# Patient Record
Sex: Male | Born: 1990 | Race: White | Hispanic: No | Marital: Married | Smoking: Never smoker
Health system: Southern US, Community
[De-identification: ages and names within clinical notes are randomized; demographics above are authoritative.]

## PROBLEM LIST (undated history)

## (undated) HISTORY — PX: ELBOW SURGERY: SHX618

## (undated) HISTORY — PX: KNEE SURGERY: SHX244

---
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
## 2011-04-07 ENCOUNTER — Ambulatory Visit: Payer: BC Managed Care – PPO | Attending: Orthopedic Surgery | Admitting: Physical Therapy

## 2011-04-07 DIAGNOSIS — R5381 Other malaise: Secondary | ICD-10-CM | POA: Insufficient documentation

## 2011-04-07 DIAGNOSIS — M25669 Stiffness of unspecified knee, not elsewhere classified: Secondary | ICD-10-CM | POA: Insufficient documentation

## 2011-04-07 DIAGNOSIS — R262 Difficulty in walking, not elsewhere classified: Secondary | ICD-10-CM | POA: Insufficient documentation

## 2011-04-07 DIAGNOSIS — IMO0001 Reserved for inherently not codable concepts without codable children: Secondary | ICD-10-CM | POA: Insufficient documentation

## 2011-04-07 DIAGNOSIS — M25569 Pain in unspecified knee: Secondary | ICD-10-CM | POA: Insufficient documentation

## 2011-04-09 ENCOUNTER — Ambulatory Visit: Payer: BC Managed Care – PPO | Admitting: Physical Therapy

## 2011-04-10 ENCOUNTER — Ambulatory Visit: Payer: BC Managed Care – PPO | Admitting: Physical Therapy

## 2011-04-14 ENCOUNTER — Ambulatory Visit: Payer: BC Managed Care – PPO | Attending: Orthopedic Surgery | Admitting: Physical Therapy

## 2011-04-14 DIAGNOSIS — R262 Difficulty in walking, not elsewhere classified: Secondary | ICD-10-CM | POA: Insufficient documentation

## 2011-04-14 DIAGNOSIS — R5381 Other malaise: Secondary | ICD-10-CM | POA: Insufficient documentation

## 2011-04-14 DIAGNOSIS — M25569 Pain in unspecified knee: Secondary | ICD-10-CM | POA: Insufficient documentation

## 2011-04-14 DIAGNOSIS — IMO0001 Reserved for inherently not codable concepts without codable children: Secondary | ICD-10-CM | POA: Insufficient documentation

## 2011-04-14 DIAGNOSIS — M25669 Stiffness of unspecified knee, not elsewhere classified: Secondary | ICD-10-CM | POA: Insufficient documentation

## 2011-04-15 ENCOUNTER — Ambulatory Visit: Payer: BC Managed Care – PPO | Admitting: *Deleted

## 2011-04-17 ENCOUNTER — Ambulatory Visit: Payer: BC Managed Care – PPO | Admitting: *Deleted

## 2011-04-21 ENCOUNTER — Ambulatory Visit: Payer: BC Managed Care – PPO | Admitting: Physical Therapy

## 2011-04-22 ENCOUNTER — Encounter: Payer: BC Managed Care – PPO | Admitting: Physical Therapy

## 2011-04-23 ENCOUNTER — Ambulatory Visit: Payer: BC Managed Care – PPO | Admitting: Physical Therapy

## 2011-04-28 ENCOUNTER — Ambulatory Visit: Payer: BC Managed Care – PPO | Admitting: Physical Therapy

## 2011-04-29 ENCOUNTER — Ambulatory Visit: Payer: BC Managed Care – PPO | Admitting: *Deleted

## 2011-05-01 ENCOUNTER — Ambulatory Visit: Payer: BC Managed Care – PPO | Admitting: *Deleted

## 2011-05-07 ENCOUNTER — Ambulatory Visit: Payer: BC Managed Care – PPO | Admitting: Physical Therapy

## 2011-05-09 ENCOUNTER — Ambulatory Visit: Payer: BC Managed Care – PPO | Admitting: Physical Therapy

## 2011-05-12 ENCOUNTER — Ambulatory Visit: Payer: BC Managed Care – PPO | Admitting: Physical Therapy

## 2011-05-14 ENCOUNTER — Encounter: Payer: BC Managed Care – PPO | Admitting: Physical Therapy

## 2011-05-16 ENCOUNTER — Ambulatory Visit: Payer: BC Managed Care – PPO | Attending: Orthopedic Surgery | Admitting: Physical Therapy

## 2011-05-16 DIAGNOSIS — R5381 Other malaise: Secondary | ICD-10-CM | POA: Insufficient documentation

## 2011-05-16 DIAGNOSIS — M25669 Stiffness of unspecified knee, not elsewhere classified: Secondary | ICD-10-CM | POA: Insufficient documentation

## 2011-05-16 DIAGNOSIS — IMO0001 Reserved for inherently not codable concepts without codable children: Secondary | ICD-10-CM | POA: Insufficient documentation

## 2011-05-16 DIAGNOSIS — M25569 Pain in unspecified knee: Secondary | ICD-10-CM | POA: Insufficient documentation

## 2011-05-16 DIAGNOSIS — R262 Difficulty in walking, not elsewhere classified: Secondary | ICD-10-CM | POA: Insufficient documentation

## 2012-08-16 ENCOUNTER — Telehealth: Payer: Self-pay | Admitting: Nurse Practitioner

## 2012-08-16 NOTE — Telephone Encounter (Signed)
appt made

## 2012-09-02 ENCOUNTER — Telehealth: Payer: Self-pay | Admitting: Nurse Practitioner

## 2012-09-02 ENCOUNTER — Encounter: Payer: Self-pay | Admitting: Nurse Practitioner

## 2012-09-02 ENCOUNTER — Ambulatory Visit (INDEPENDENT_AMBULATORY_CARE_PROVIDER_SITE_OTHER): Payer: BC Managed Care – PPO | Admitting: Nurse Practitioner

## 2012-09-02 VITALS — BP 123/70 | HR 84 | Temp 97.9°F | Ht 72.0 in | Wt 211.0 lb

## 2012-09-02 DIAGNOSIS — Z139 Encounter for screening, unspecified: Secondary | ICD-10-CM

## 2012-09-02 DIAGNOSIS — Z0000 Encounter for general adult medical examination without abnormal findings: Secondary | ICD-10-CM

## 2012-09-02 DIAGNOSIS — Z111 Encounter for screening for respiratory tuberculosis: Secondary | ICD-10-CM

## 2012-09-02 NOTE — Progress Notes (Signed)
  Subjective:    Patient ID: Albert Gross, male    DOB: [birth year], 22 y.o.   MRN: [id number]  HPI- Patient here today for a CPE. 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 He is doing well without complaints.    Review of Systems  Constitutional: Negative.   HENT: Negative.   Eyes: Negative.   Respiratory: Negative.   Cardiovascular: Negative.   Gastrointestinal: Negative.   Endocrine: Negative.   Genitourinary: Negative.   Musculoskeletal: Negative.   Allergic/Immunologic: Negative.   Neurological: Negative.   Psychiatric/Behavioral: Negative.        Objective:   Physical Exam  Constitutional: He is oriented to person, place, and time. He appears well-developed and well-nourished.  HENT:  Head: Normocephalic.  Right Ear: External ear normal.  Mouth/Throat: Oropharynx is clear and moist.  Eyes: Conjunctivae and EOM are normal. Pupils are equal, round, and reactive to light.  Neck: Normal range of motion. Neck supple.  Cardiovascular: Normal rate, regular rhythm and normal heart sounds.   No murmur heard. Pulmonary/Chest: Effort normal and breath sounds normal.  Abdominal: Soft. Bowel sounds are normal.  Musculoskeletal: Normal range of motion.  Neurological: He is alert and oriented to person, place, and time.  Skin: Skin is warm and dry.  Psychiatric: He has a normal mood and affect. His behavior is normal. Judgment and thought content normal.          Assessment & Plan:

## 2012-09-02 NOTE — Patient Instructions (Signed)
Health Maintenance, 18- to 21-Year-Old SCHOOL PERFORMANCE After high school completion, the young adult may be attending college, technical or vocational school, or entering the military or the work force. SOCIAL AND EMOTIONAL DEVELOPMENT The young adult establishes adult relationships and explores sexual identity. Young adults may be living at home or in a college dorm or apartment. Increasing independence is important with young adults. Throughout adolescence, teens should assume responsibility of their own health care. IMMUNIZATIONS Most young adults should be fully vaccinated. A booster dose of Tdap (tetanus, diphtheria, and pertussis, or "whooping cough"), a dose of meningococcal vaccine to protect against a certain type of bacterial meningitis, hepatitis A, human papillomarvirus (HPV), chickenpox, or measles vaccines may be indicated, if not given at an earlier age. Annual influenza or "flu" vaccination should be considered during flu season.   TESTING Annual screening for vision and hearing problems is recommended. Vision should be screened objectively at least once between 18 and 21 years of age. The young adult may be screened for anemia or tuberculosis. Young adults should have a blood test to check for high cholesterol during this time period. Young adults should be screened for use of alcohol and drugs. If the young adult is sexually active, screening for sexually transmitted infections, pregnancy, or HIV may be performed. Screening for cervical cancer should be performed within 3 years of beginning sexual activity. NUTRITION AND ORAL HEALTH  Adequate calcium intake is important. Consume 3 servings of low-fat milk and dairy products daily. For those who do not drink milk or consume dairy products, calcium enriched foods, such as juice, bread, or cereal, dark, leafy greens, or canned fish are alternate sources of calcium.   Drink plenty of water. Limit fruit juice to 8 to 12 ounces per day.  Avoid sugary beverages or sodas.   Discourage skipping meals, especially breakfast. Teens should eat a good variety of vegetables and fruits, as well as lean meats.   Avoid high fat, high salt, and high sugar foods, such as candy, chips, and cookies.   Encourage young adults to participate in meal planning and preparation.   Eat meals together as a family whenever possible. Encourage conversation at mealtime.   Limit fast food choices and eating out at restaurants.   Brush teeth twice a day and floss.   Schedule dental exams twice a year.  SLEEP Regular sleep habits are important. PHYSICAL, SOCIAL, AND EMOTIONAL DEVELOPMENT  One hour of regular physical activity daily is recommended. Continue to participate in sports.   Encourage young adults to develop their own interests and consider community service or volunteerism.   Provide guidance to the young adult in making decisions about college and work plans.   Make sure that young adults know that they should never be in a situation that makes them uncomfortable, and they should tell partners if they do not want to engage in sexual activity.   Talk to the young adult about body image. Eating disorders may be noted at this time. Young adults may also be concerned about being overweight. Monitor the young adult for weight gain or loss.   Mood disturbances, depression, anxiety, alcoholism, or attention problems may be noted in young adults. Talk to the caregiver if there are concerns about mental illness.   Negotiate limit setting and independent decision making.   Encourage the young adult to handle conflict without physical violence.   Avoid loud noises which may impair hearing.   Limit television and computer time to 2 hours per   day. Individuals who engage in excessive sedentary activity are more likely to become overweight.  RISK BEHAVIORS  Sexually active young adults need to take precautions against pregnancy and sexually  transmitted infections. Talk to young adults about contraception.   Provide a tobacco-free and drug-free environment for the young adult. Talk to the young adult about drug, tobacco, and alcohol use among friends or at friends' homes. Make sure the young adult knows that smoking tobacco or marijuana and taking drugs have health consequences and may impact brain development.   Teach the young adult about appropriate use of over-the-counter or prescription medicines.   Establish guidelines for driving and for riding with friends.   Talk to young adults about the risks of drinking and driving or boating. Encourage the young adult to call you if he or she or friends have been drinking or using drugs.   Remind young adults to wear seat belts at all times in cars and life vests in boats.   Young adults should always wear a properly fitted helmet when they are riding a bicycle.   Use caution with all-terrain vehicles (ATVs) or other motorized vehicles.   Do not keep handguns in the home. (If you do, the gun and ammunition should be locked separately and out of the young adult's access.)   Equip your home with smoke detectors and change the batteries regularly. Make sure all family members know the fire escape plans for your home.   Teach young adults not to swim alone and not to dive in shallow water.   All individuals should wear sunscreen that protects against UVA and UVB light with at least a sun protection factor (SPF) of 30 when out in the sun. This minimizes sun burning.  WHAT'S NEXT? Young adults should visit their pediatrician or family physician yearly. By young adulthood, health care should be transitioned to a family physician or internal medicine specialist. Sexually active females may want to begin annual physical exams with a gynecologist. Document Released: 07/24/2006 Document Revised: 07/21/2011 Document Reviewed: 08/13/2006 ExitCare Patient Information 2013 [employer].    

## 2012-09-02 NOTE — Addendum Note (Signed)
Addended by: Bennie Pierini on: 09/02/2012 03:24 PM   Modules accepted: Orders

## 2012-09-02 NOTE — Telephone Encounter (Signed)
Patient aware last one was 05/18/07

## 2012-09-02 NOTE — Progress Notes (Signed)
Pt tolerated ppd well

## 2012-09-04 LAB — TB SKIN TEST
Induration: 0 mm
TB Skin Test: NEGATIVE

## 2012-12-08 ENCOUNTER — Ambulatory Visit (INDEPENDENT_AMBULATORY_CARE_PROVIDER_SITE_OTHER): Payer: BC Managed Care – PPO | Admitting: Physician Assistant

## 2012-12-08 ENCOUNTER — Encounter: Payer: Self-pay | Admitting: Physician Assistant

## 2012-12-08 VITALS — BP 111/68 | HR 57 | Temp 99.1°F | Ht 72.0 in | Wt 216.0 lb

## 2012-12-08 DIAGNOSIS — L259 Unspecified contact dermatitis, unspecified cause: Secondary | ICD-10-CM

## 2012-12-08 DIAGNOSIS — L309 Dermatitis, unspecified: Secondary | ICD-10-CM

## 2012-12-08 NOTE — Patient Instructions (Signed)
Insect Bite  Mosquitoes, flies, fleas, bedbugs, and many other insects can bite. Insect bites are different from insect stings. A sting is when venom is injected into the skin. Some insect bites can transmit infectious diseases.  SYMPTOMS   Insect bites usually turn red, swell, and itch for 2 to 4 days. They often go away on their own.  TREATMENT   Your caregiver may prescribe antibiotic medicines if a bacterial infection develops in the bite.  HOME CARE INSTRUCTIONS   Do not scratch the bite area.   Keep the bite area clean and dry. Wash the bite area thoroughly with soap and water.   Put ice or cool compresses on the bite area.   Put ice in a plastic bag.   Place a towel between your skin and the bag.   Leave the ice on for 20 minutes, 4 times a day for the first 2 to 3 days, or as directed.   You may apply a baking soda paste, cortisone cream, or calamine lotion to the bite area as directed by your caregiver. This can help reduce itching and swelling.   Only take over-the-counter or prescription medicines as directed by your caregiver.   If you are given antibiotics, take them as directed. Finish them even if you start to feel better.  You may need a tetanus shot if:   You cannot remember when you had your last tetanus shot.   You have never had a tetanus shot.   The injury broke your skin.  If you get a tetanus shot, your arm may swell, get red, and feel warm to the touch. This is common and not a problem. If you need a tetanus shot and you choose not to have one, there is a rare chance of getting tetanus. Sickness from tetanus can be serious.  SEEK IMMEDIATE MEDICAL CARE IF:    You have increased pain, redness, or swelling in the bite area.   You see a red line on the skin coming from the bite.   You have a fever.   You have joint pain.   You have a headache or neck pain.   You have unusual weakness.   You have a rash.   You have chest pain or shortness of breath.    You have abdominal pain, nausea, or vomiting.   You feel unusually tired or sleepy.  MAKE SURE YOU:    Understand these instructions.   Will watch your condition.   Will get help right away if you are not doing well or get worse.  Document Released: 06/05/2004 Document Revised: 07/21/2011 Document Reviewed: 11/27/2010  ExitCare Patient Information 2014 [employer].

## 2012-12-08 NOTE — Progress Notes (Signed)
Subjective:     Patient ID: Albert Gross, male   DOB: [birth year], 22 y.o.   MRN: [id number]  HPI Pt with bug bites or rash to the L lateral mid back No pain to the sites Area there for 2 -3 days He denies any pain  He just started having pruritus  Review of Systems  All other systems reviewed and are negative.       Objective:   Physical Exam 3 discrete areas to the L lateral back No surrounding edema or induration No drainage from the sites No other lesions seen    Assessment:     Derm    Plan:     OTC antihistamines Cool compresses F/u prn

## 2012-12-17 ENCOUNTER — Encounter: Payer: Self-pay | Admitting: Nurse Practitioner

## 2012-12-17 ENCOUNTER — Ambulatory Visit (INDEPENDENT_AMBULATORY_CARE_PROVIDER_SITE_OTHER): Payer: BC Managed Care – PPO | Admitting: Nurse Practitioner

## 2012-12-17 VITALS — BP 126/74 | HR 83 | Temp 98.9°F | Ht 70.0 in | Wt 217.0 lb

## 2012-12-17 DIAGNOSIS — R1032 Left lower quadrant pain: Secondary | ICD-10-CM

## 2012-12-17 DIAGNOSIS — K5900 Constipation, unspecified: Secondary | ICD-10-CM

## 2012-12-17 DIAGNOSIS — L03319 Cellulitis of trunk, unspecified: Secondary | ICD-10-CM

## 2012-12-17 DIAGNOSIS — L03312 Cellulitis of back [any part except buttock]: Secondary | ICD-10-CM

## 2012-12-17 MED ORDER — AMOXICILLIN 875 MG PO TABS: 875.0000 mg | ORAL_TABLET | Freq: Two times a day (BID) | ORAL | Status: DC

## 2012-12-17 NOTE — Progress Notes (Signed)
  Subjective:    Patient ID: Albert Gross, male    DOB: September 06, 1990, 22 y.o.   MRN: [id number]  HPI  Patient here today c/o left sided pain and swelling. Voiding this morning and was a little difficult. Had a bowel movement today.    Review of Systems  All other systems reviewed and are negative.       Objective:   Physical Exam  Constitutional: He appears well-developed and well-nourished.  Cardiovascular: Normal rate and normal heart sounds.   Pulmonary/Chest: Effort normal and breath sounds normal.  Abdominal: Soft. Bowel sounds are normal. There is tenderness (mild left lower quadrant).  Skin:  3 small bug bites with erythema on left flank  Psychiatric: He has a normal mood and affect. His behavior is normal. Judgment and thought content normal.     BP 126/74  Pulse 83  Temp(Src) 98.9 F (37.2 C) (Oral)  Ht 5\' 10"  (1.778 m)  Wt 217 lb (98.431 kg)  BMI 31.14 kg/m2      Assessment & Plan:  1. Constipation miralax Increase fiber in diet   2. LLQ pain Monitor pain- RTO if worsens  3. Cellulitis of back Keep clean and dry Do not pick at lesions RTO if not improving - amoxicillin (AMOXIL) 875 MG tablet; Take 1 tablet (875 mg total) by mouth 2 (two) times daily.  Dispense: 20 tablet; Refill: 0  Mary-Margaret Daphine Deutscher, FNP

## 2012-12-17 NOTE — Patient Instructions (Addendum)
Cellulitis Cellulitis is an infection of the skin and the tissue beneath it. The infected area is usually red and tender. Cellulitis occurs most often in the arms and lower legs.  CAUSES  Cellulitis is caused by bacteria that enter the skin through cracks or cuts in the skin. The most common types of bacteria that cause cellulitis are Staphylococcus and Streptococcus. SYMPTOMS   Redness and warmth.  Swelling.  Tenderness or pain.  Fever. DIAGNOSIS  Your caregiver can usually determine what is wrong based on a physical exam. Blood tests may also be done. TREATMENT  Treatment usually involves taking an antibiotic medicine. HOME CARE INSTRUCTIONS   Take your antibiotics as directed. Finish them even if you start to feel better.  Keep the infected arm or leg elevated to reduce swelling.  Apply a warm cloth to the affected area up to 4 times per day to relieve pain.  Only take over-the-counter or prescription medicines for pain, discomfort, or fever as directed by your caregiver.  Keep all follow-up appointments as directed by your caregiver. SEEK MEDICAL CARE IF:   You notice red streaks coming from the infected area.  Your red area gets larger or turns dark in color.  Your bone or joint underneath the infected area becomes painful after the skin has healed.  Your infection returns in the same area or another area.  You notice a swollen bump in the infected area.  You develop new symptoms. SEEK IMMEDIATE MEDICAL CARE IF:   You have a fever.  You feel very sleepy.  You develop vomiting or diarrhea.  You have a general ill feeling (malaise) with muscle aches and pains. MAKE SURE YOU:   Understand these instructions.  Will watch your condition.  Will get help right away if you are not doing well or get worse. Document Released: 02/05/2005 Document Revised: 10/28/2011 Document Reviewed: 07/14/2011 ExitCare Patient Information 2014 [employer].  

## 2013-05-24 ENCOUNTER — Telehealth: Payer: Self-pay | Admitting: Nurse Practitioner

## 2013-05-24 ENCOUNTER — Other Ambulatory Visit: Payer: Self-pay | Admitting: Nurse Practitioner

## 2013-05-24 MED ORDER — OSELTAMIVIR PHOSPHATE 75 MG PO CAPS: 75.0000 mg | ORAL_CAPSULE | Freq: Two times a day (BID) | ORAL | Status: DC

## 2013-05-24 NOTE — Telephone Encounter (Signed)
Patient father aware rx sent in

## 2013-05-24 NOTE — Telephone Encounter (Signed)
Already taken care of

## 2014-06-15 ENCOUNTER — Emergency Department (HOSPITAL_COMMUNITY)
Admission: EM | Admit: 2014-06-15 | Discharge: 2014-06-15 | Disposition: A | Discharge status: Home / Self Care | Payer: BLUE CROSS/BLUE SHIELD | Attending: Emergency Medicine | Admitting: Emergency Medicine

## 2014-06-15 ENCOUNTER — Encounter (HOSPITAL_COMMUNITY): Payer: Self-pay | Admitting: Emergency Medicine

## 2014-06-15 DIAGNOSIS — Z792 Long term (current) use of antibiotics: Secondary | ICD-10-CM | POA: Insufficient documentation

## 2014-06-15 DIAGNOSIS — X58XXXA Exposure to other specified factors, initial encounter: Secondary | ICD-10-CM | POA: Insufficient documentation

## 2014-06-15 DIAGNOSIS — Z79899 Other long term (current) drug therapy: Secondary | ICD-10-CM | POA: Diagnosis not present

## 2014-06-15 DIAGNOSIS — Y9389 Activity, other specified: Secondary | ICD-10-CM | POA: Insufficient documentation

## 2014-06-15 DIAGNOSIS — S0502XA Injury of conjunctiva and corneal abrasion without foreign body, left eye, initial encounter: Secondary | ICD-10-CM | POA: Insufficient documentation

## 2014-06-15 DIAGNOSIS — Y9289 Other specified places as the place of occurrence of the external cause: Secondary | ICD-10-CM | POA: Insufficient documentation

## 2014-06-15 DIAGNOSIS — S0592XA Unspecified injury of left eye and orbit, initial encounter: Secondary | ICD-10-CM | POA: Diagnosis present

## 2014-06-15 DIAGNOSIS — Y998 Other external cause status: Secondary | ICD-10-CM | POA: Diagnosis not present

## 2014-06-15 MED ORDER — TETRACAINE HCL 0.5 % OP SOLN
1.0000 [drp] | Freq: Once | OPHTHALMIC | Status: AC
  Administered 2014-06-15: 1 [drp] via OPHTHALMIC
  Filled 2014-06-15: qty 2

## 2014-06-15 MED ORDER — POLYMYXIN B-TRIMETHOPRIM 10000-0.1 UNIT/ML-% OP SOLN: 1.0000 [drp] | OPHTHALMIC | Status: DC

## 2014-06-15 MED ORDER — FLUORESCEIN SODIUM 1 MG OP STRP
1.0000 | ORAL_STRIP | Freq: Once | OPHTHALMIC | Status: AC
  Administered 2014-06-15: 1 via OPHTHALMIC
  Filled 2014-06-15: qty 1

## 2014-06-15 MED ORDER — KETOROLAC TROMETHAMINE 0.5 % OP SOLN: 1.0000 [drp] | Freq: Four times a day (QID) | OPHTHALMIC | Status: DC

## 2014-06-15 NOTE — ED Notes (Signed)
Pt c/o saw dust in left eye since around 1130 this moring. Pt states that he was using a saw to cut plywood.  Pt has been trying to get it out but left eye is reddened and irritated.

## 2014-06-15 NOTE — Discharge Instructions (Signed)
Take the prescribed medication as directed. Follow-up with Dr. Randon GoldsmithLyles if symptoms worsen or you begin experiencing trouble with your vision. Return to the ED for new or worsening symptoms.

## 2014-06-15 NOTE — ED Provider Notes (Signed)
CSN: [id number]     Arrival date & time 06/15/14  1255 History  This chart was scribed for non-physician practitioner, Sharilyn SitesLisa Marisa Hufstetler, PA-C and Dorann OuErin Raspet, PA-student working with Albert RudeNathan R. Albert PayorPickering, MD by Luisa DagoPriscilla Tutu, ED scribe. This patient was seen in room WTR7/WTR7 and the patient's care was started at 1:50 PM.    Chief Complaint  Patient presents with  . Foreign Body in Eye   The history is provided by the patient and medical records. No language interpreter was used.   HPI Comments: Albert Gross is a 24 y.o. male who does not wear contacts or corrective lenses presents to the Emergency Department complaining of foreign body in his left eye that occurred 2 hours ago. Pt states that he thinks that he may have gotten some sawdust in his left eyes. He states that he tried to wash his eye out to get the foreign body out of his eye, but he was unsuccessful. Positive associated eye redness and pain. He states that the pain is made worse by blinking. Pt states that when he pulls his lower lid out he does not feel the foreign body sensation. Denies any prior eye problems.  He does not wear glasses or contact lenses. Pt denies any fever, neck pain, sore throat, visual disturbance, CP, cough, SOB, abdominal pain, nausea, emesis, diarrhea, urinary symptoms, back pain, HA, weakness, numbness and rash as associated symptoms. No tobacco or alcohol usage.  History reviewed. No pertinent past medical history. Past Surgical History  Procedure Laterality Date  . Knee surgery    . Elbow surgery     Family History  Problem Relation Age of Onset  . Diabetes Father    History  Substance Use Topics  . Smoking status: Never Smoker   . Smokeless tobacco: Current User    Types: Snuff  . Alcohol Use: No    Review of Systems  Constitutional: Negative for fever and chills.  HENT: Negative for congestion.   Eyes: Positive for pain and redness.  Respiratory: Negative for cough and shortness of breath.    Gastrointestinal: Negative for nausea, vomiting and abdominal pain.  Genitourinary: Negative for dysuria.  Skin: Negative for wound.  All other systems reviewed and are negative.  Allergies  Review of patient's allergies indicates no known allergies.  Home Medications   Prior to Admission medications   Medication Sig Start Date End Date Taking? Authorizing Provider  amoxicillin (AMOXIL) 875 MG tablet Take 1 tablet (875 mg total) by mouth 2 (two) times daily. 12/17/12   Mary-Margaret Daphine DeutscherMartin, FNP  oseltamivir (TAMIFLU) 75 MG capsule Take 1 capsule (75 mg total) by mouth 2 (two) times daily. 05/24/13   Mary-Margaret Daphine DeutscherMartin, FNP   BP 126/86 mmHg  Pulse 77  SpO2 99%   Physical Exam  Constitutional: He is oriented to person, place, and time. He appears well-developed and well-nourished. No distress.  HENT:  Head: Normocephalic and atraumatic.  Mouth/Throat: Oropharynx is clear and moist.  Eyes: EOM are normal. Pupils are equal, round, and reactive to light. No foreign body present in the left eye. Left conjunctiva is injected. Left conjunctiva has no hemorrhage.  Slit lamp exam:      The left eye shows corneal abrasion. The left eye shows no corneal flare, no corneal ulcer and no fluorescein uptake.  No lid edema or erythema; Left conjunctiva injected, eye tearing; no visible FB; EOMs intact and non-painful; left corneal abrasion noted; no ulcer or flare; negative fluorescein uptake  Neck: Normal  range of motion. Neck supple.  Cardiovascular: Normal rate, regular rhythm and normal heart sounds.   Pulmonary/Chest: Effort normal and breath sounds normal. No respiratory distress.  Musculoskeletal: Normal range of motion.  Neurological: He is alert and oriented to person, place, and time.  Skin: Skin is warm and dry. He is not diaphoretic.  Psychiatric: He has a normal mood and affect.  Nursing note and vitals reviewed.   ED Course  Procedures (including critical care  time)  COORDINATION OF CARE: 1:55 PM- Pt advised of plan for treatment and pt agrees.  Labs Review Labs Reviewed - No data to display  Imaging Review No results found.   EKG Interpretation None      MDM   Final diagnoses:  Corneal abrasion, left, initial encounter   24 year old male with sinus exposure to left eye earlier this morning. He continues to have a foreign body sensation in his left eye. No foreign body noted on exam. His left conjunctiva is injected and eyes continuously tearing. There is no visible foreign body. He does have a small corneal abrasion noted without ulcer or flare. No fluorescein uptake. Visual Acuity - Bilateral Distance: 20/20; R Distance: 20/40 ; L Distance: 20/50.  Patient we started on Polytrim and Acular drops. He is to follow-up with ophthalmology for any new or worsening symptoms.  Discussed plan with patient, he/she acknowledged understanding and agreed with plan of care.  Return precautions given for new or worsening symptoms.  I personally performed the services described in this documentation, which was scribed in my presence. The recorded information has been reviewed and is accurate.  Garlon Hatchet, PA-C 06/15/14 1429  Albert Gross. Albert Payor, MD 06/19/14 [phone]

## 2018-01-25 ENCOUNTER — Ambulatory Visit: Payer: BLUE CROSS/BLUE SHIELD | Admitting: Family

## 2018-01-25 ENCOUNTER — Encounter: Payer: Self-pay | Admitting: Family

## 2018-01-25 VITALS — BP 116/77 | HR 80 | Temp 98.2°F | Ht 70.0 in | Wt 226.0 lb

## 2018-01-25 DIAGNOSIS — Z0000 Encounter for general adult medical examination without abnormal findings: Secondary | ICD-10-CM | POA: Diagnosis not present

## 2018-01-25 DIAGNOSIS — Z23 Encounter for immunization: Secondary | ICD-10-CM

## 2018-01-25 DIAGNOSIS — E669 Obesity, unspecified: Secondary | ICD-10-CM | POA: Insufficient documentation

## 2018-01-25 NOTE — Patient Instructions (Signed)

## 2018-01-25 NOTE — Addendum Note (Signed)
Addended by: Almeta MonasSTONE, JANIE M on: 01/25/2018 10:01 AM   Modules accepted: Orders

## 2018-01-25 NOTE — Progress Notes (Signed)
   Subjective:    Patient ID: Albert Gross, male    DOB: 1990-05-19, 27 y.o.   MRN: [id number]  Chief Complaint  Patient presents with  . New Patient (Initial Visit)    HPI PT presents to the office today for CPE. He states he is just getting off of his parents insurance and needs a TDAP.  States he is having a baby December 2019 and needs to get his TDAP. Pt denies any headache, palpitations, SOB, or edema at this time.     Review of Systems  All other systems reviewed and are negative.      Objective:   Physical Exam  Constitutional: He is oriented to person, place, and time. He appears well-developed and well-nourished. No distress.  HENT:  Head: Normocephalic.  Right Ear: External ear normal.  Left Ear: External ear normal.  Mouth/Throat: Oropharynx is clear and moist.  Eyes: Pupils are equal, round, and reactive to light. Right eye exhibits no discharge. Left eye exhibits no discharge.  Neck: Normal range of motion. Neck supple. No thyromegaly present.  Cardiovascular: Normal rate, regular rhythm, normal heart sounds and intact distal pulses.  No murmur heard. Pulmonary/Chest: Effort normal and breath sounds normal. No respiratory distress. He has no wheezes.  Abdominal: Soft. Bowel sounds are normal. He exhibits no distension. There is no tenderness.  Musculoskeletal: Normal range of motion. He exhibits no edema or tenderness.  Neurological: He is alert and oriented to person, place, and time. He has normal reflexes. No cranial nerve deficit.  Skin: Skin is warm and dry. No rash noted. No erythema.  Psychiatric: He has a normal mood and affect. His behavior is normal. Judgment and thought content normal.  Vitals reviewed.     BP 116/77   Pulse 80   Temp 98.2 F (36.8 C) (Oral)   Ht _0  (1.778 m)   Wt 226 lb (102.5 kg)   BMI 32.43 kg/m      Assessment & Plan:  Albert Gross comes in today with chief complaint of New Patient (Initial  Visit)   Diagnosis and orders addressed:  1. Annual physical exam - CBC with Differential/Platelet - CMP14+EGFR - Lipid panel - TSH  2. Obesity (BMI 30-39.9) - CBC with Differential/Platelet - CMP14+EGFR   Labs pending Health Maintenance reviewed Diet and exercise encouraged  Follow up plan: 1 year    Evelina Dun, FNP

## 2018-01-26 LAB — CMP14+EGFR
ALT: 33 IU/L (ref 0–44)
AST: 25 IU/L (ref 0–40)
Albumin/Globulin Ratio: 2.1 (ref 1.2–2.2)
Albumin: 5 g/dL (ref 3.5–5.5)
Alkaline Phosphatase: 77 IU/L (ref 39–117)
BUN/Creatinine Ratio: 15 (ref 9–20)
BUN: 14 mg/dL (ref 6–20)
Bilirubin Total: 0.4 mg/dL (ref 0.0–1.2)
CO2: 25 mmol/L (ref 20–29)
Calcium: 10 mg/dL (ref 8.7–10.2)
Chloride: 99 mmol/L (ref 96–106)
Creatinine, Ser: 0.94 mg/dL (ref 0.76–1.27)
GFR calc Af Amer: 130 mL/min/{1.73_m2} (ref >59)
GFR calc non Af Amer: 112 mL/min/{1.73_m2} (ref >59)
Globulin, Total: 2.4 g/dL (ref 1.5–4.5)
Glucose: 97 mg/dL (ref 65–99)
Potassium: 4.7 mmol/L (ref 3.5–5.2)
Sodium: 141 mmol/L (ref 134–144)
Total Protein: 7.4 g/dL (ref 6.0–8.5)

## 2018-01-26 LAB — LIPID PANEL
Chol/HDL Ratio: 3.2 ratio (ref 0.0–5.0)
Cholesterol, Total: 146 mg/dL (ref 100–199)
HDL: 45 mg/dL (ref >39)
LDL Calculated: 57 mg/dL (ref 0–99)
Triglycerides: 222 mg/dL — ABNORMAL HIGH (ref 0–149)
VLDL Cholesterol Cal: 44 mg/dL — ABNORMAL HIGH (ref 5–40)

## 2018-01-26 LAB — CBC WITH DIFFERENTIAL/PLATELET
Basophils Absolute: 0 10*3/uL (ref 0.0–0.2)
Basos: 0 %
EOS (ABSOLUTE): 0.2 10*3/uL (ref 0.0–0.4)
Eos: 2 %
Hematocrit: 48.1 % (ref 37.5–51.0)
Hemoglobin: 16.6 g/dL (ref 13.0–17.7)
Immature Grans (Abs): 0 10*3/uL (ref 0.0–0.1)
Immature Granulocytes: 0 %
Lymphocytes Absolute: 2.3 10*3/uL (ref 0.7–3.1)
Lymphs: 27 %
MCH: 31.5 pg (ref 26.6–33.0)
MCHC: 34.5 g/dL (ref 31.5–35.7)
MCV: 91 fL (ref 79–97)
Monocytes Absolute: 0.8 10*3/uL (ref 0.1–0.9)
Monocytes: 10 %
Neutrophils Absolute: 5.2 10*3/uL (ref 1.4–7.0)
Neutrophils: 61 %
Platelets: 238 10*3/uL (ref 150–450)
RBC: 5.27 x10E6/uL (ref 4.14–5.80)
RDW: 13.1 % (ref 12.3–15.4)
WBC: 8.6 10*3/uL (ref 3.4–10.8)

## 2018-01-26 LAB — TSH: TSH: 1.49 u[IU]/mL (ref 0.450–4.500)

## 2018-03-16 ENCOUNTER — Ambulatory Visit (INDEPENDENT_AMBULATORY_CARE_PROVIDER_SITE_OTHER): Payer: PRIVATE HEALTH INSURANCE

## 2018-03-16 DIAGNOSIS — Z23 Encounter for immunization: Secondary | ICD-10-CM | POA: Diagnosis not present

## 2021-02-19 ENCOUNTER — Ambulatory Visit: Payer: PRIVATE HEALTH INSURANCE | Admitting: Family

## 2021-02-20 ENCOUNTER — Encounter: Payer: Self-pay | Admitting: Family

## 2022-01-03 ENCOUNTER — Encounter: Payer: Self-pay | Admitting: Urology

## 2022-01-03 ENCOUNTER — Ambulatory Visit (INDEPENDENT_AMBULATORY_CARE_PROVIDER_SITE_OTHER): Payer: 59 | Admitting: Urology

## 2022-01-03 VITALS — BP 130/87 | HR 93

## 2022-01-03 DIAGNOSIS — Z3009 Encounter for other general counseling and advice on contraception: Secondary | ICD-10-CM | POA: Diagnosis not present

## 2022-01-03 MED ORDER — DIAZEPAM 10 MG PO TABS: 10.0000 mg | ORAL_TABLET | Freq: Once | ORAL | 0 refills | Status: AC

## 2022-04-16 ENCOUNTER — Telehealth: Payer: Self-pay

## 2022-04-18 ENCOUNTER — Ambulatory Visit (INDEPENDENT_AMBULATORY_CARE_PROVIDER_SITE_OTHER): Payer: 59 | Admitting: Urology

## 2022-04-18 ENCOUNTER — Encounter: Payer: Self-pay | Admitting: Urology

## 2022-04-18 VITALS — BP 142/101 | HR 128

## 2022-04-18 DIAGNOSIS — Z3009 Encounter for other general counseling and advice on contraception: Secondary | ICD-10-CM

## 2022-04-18 DIAGNOSIS — Z302 Encounter for sterilization: Secondary | ICD-10-CM | POA: Diagnosis not present

## 2022-04-18 MED ORDER — LIDOCAINE HCL 2 % IJ SOLN
20.0000 mL | Freq: Once | INTRAMUSCULAR | Status: AC
  Administered 2022-04-18: 400 mg

## 2022-06-03 ENCOUNTER — Other Ambulatory Visit: Payer: Self-pay | Admitting: Urology

## 2022-06-04 LAB — POST-VAS SPERM EVALUATION,QUAL: Volume: 1 mL

## 2022-06-10 NOTE — Progress Notes (Signed)
Letter sent.

## 2023-01-27 ENCOUNTER — Encounter: Payer: Self-pay | Admitting: Family

## 2024-05-31 ENCOUNTER — Telehealth: Payer: PRIVATE HEALTH INSURANCE | Admitting: Nurse Practitioner

## 2024-05-31 NOTE — Progress Notes (Unsigned)
 "   Virtual Visit Consent   Albert Gross, you are scheduled for a virtual visit with Mary-Margaret Gladis, FNP, a [facility] provider, today.     Just as with appointments in the office, your consent must be obtained to participate.  Your consent will be active for this visit and any virtual visit you may have with one of our providers in the next 365 days.     If you have a MyChart account, a copy of this consent can be sent to you electronically.  All virtual visits are billed to your insurance company just like a traditional visit in the office.    As this is a virtual visit, video technology does not allow for your provider to perform a traditional examination.  This may limit your provider's ability to fully assess your condition.  If your provider identifies any concerns that need to be evaluated in person or the need to arrange testing (such as labs, EKG, etc.), we will make arrangements to do so.     Although advances in technology are sophisticated, we cannot ensure that it will always work on either your end or our end.  If the connection with a video visit is poor, the visit may have to be switched to a telephone visit.  With either a video or telephone visit, we are not always able to ensure that we have a secure connection.     I need to obtain your verbal consent now.   Are you willing to proceed with your visit today? YES   Albert Gross has provided verbal consent on 05/31/2024 for a virtual visit (video or telephone).   Mary-Margaret Gladis, FNP   Date: 05/31/2024 3:01 PM   Virtual Visit via Video Note   I, Mary-Margaret Gladis, connected with Albert Gross ([id number], 1991/04/15) on 05/31/24 at  3:30 PM EST by a video-enabled telemedicine application and verified that I am speaking with the correct person using two identifiers.  Location: Patient: Virtual Visit Location Patient: Home Provider: Virtual Visit Location Provider: Mobile   I discussed the  limitations of evaluation and management by telemedicine and the availability of in person appointments. The patient expressed understanding and agreed to proceed.    History of Present Illness: Albert Gross is a 34 y.o. who identifies as a male who was assigned male at birth, and is being seen today for ***.  HPI: HPI  ROS  Problems:  Patient Active Problem List   Diagnosis Date Noted   Obesity (BMI 30-39.9) 01/25/2018    Allergies: Allergies[1] Medications: Current Medications[2]  Observations/Objective: Patient is well-developed, well-nourished in no acute distress.  Resting comfortably *** at home.  Head is normocephalic, atraumatic.  No labored breathing. *** Speech is clear and coherent with logical content.  Patient is alert and oriented at baseline.  ***  Assessment and Plan:  ***  Follow Up Instructions: I discussed the assessment and treatment plan with the patient. The patient was provided an opportunity to ask questions and all were answered. The patient agreed with the plan and demonstrated an understanding of the instructions.  A copy of instructions were sent to the patient via MyChart.  The patient was advised to call back or seek an in-person evaluation if the symptoms worsen or if the condition fails to improve as anticipated.  Time:  I spent *** minutes with the patient via telehealth technology discussing the above problems/concerns.    Mary-Margaret Gladis, FNP    [1]  No Known Allergies [2] No current outpatient medications on file.  "

## 2024-06-01 NOTE — Progress Notes (Signed)
 erroneous
